# Patient Record
Sex: Female | Born: 1975 | Race: Black or African American | Hispanic: No | Marital: Single | Smoking: Never smoker
Health system: Southern US, Community
[De-identification: ages and names within clinical notes are randomized; demographics above are authoritative.]

## PROBLEM LIST (undated history)

## (undated) HISTORY — PX: BREAST ENHANCEMENT SURGERY: SHX7

---

## 2016-02-18 ENCOUNTER — Emergency Department (HOSPITAL_COMMUNITY)
Admission: EM | Admit: 2016-02-18 | Discharge: 2016-02-18 | Disposition: A | Discharge status: Home / Self Care | Payer: Managed Care, Other (non HMO) | Attending: Emergency Medicine | Admitting: Emergency Medicine

## 2016-02-18 ENCOUNTER — Encounter (HOSPITAL_COMMUNITY): Payer: Self-pay

## 2016-02-18 ENCOUNTER — Emergency Department (HOSPITAL_COMMUNITY): Payer: Managed Care, Other (non HMO)

## 2016-02-18 DIAGNOSIS — M545 Low back pain: Secondary | ICD-10-CM | POA: Insufficient documentation

## 2016-02-18 LAB — POC URINE PREG, ED: Preg Test, Ur: NEGATIVE

## 2016-02-18 MED ORDER — METHOCARBAMOL 500 MG PO TABS: 500.0000 mg | ORAL_TABLET | Freq: Four times a day (QID) | ORAL | 0 refills | Status: DC | PRN

## 2016-02-18 MED ORDER — MELOXICAM 7.5 MG PO TABS: 7.5000 mg | ORAL_TABLET | Freq: Every day | ORAL | 0 refills | Status: DC | PRN

## 2016-02-18 NOTE — ED Notes (Signed)
Declined W/C at D/C and was escorted to lobby by RN. 

## 2016-02-18 NOTE — ED Triage Notes (Signed)
Per Pt, Pt is coming from home with reports of severe lower back pain that started about two weeks ago with no injury noted to be associated. Denies any urinary symptoms with the pain. Reports having some tingling in her legs bilateral, but denies incontinence.

## 2016-02-18 NOTE — ED Notes (Signed)
Pt reports not having a period in August and having a light period for four days this month which is abnormal for the patient. Denies discharge.

## 2016-02-18 NOTE — ED Provider Notes (Signed)
[facility]-EMERGENCY DEPT Provider Note   CSN: [id number] Arrival date & time: 02/18/16  0705     History   Chief Complaint Chief Complaint  Patient presents with  . Back Pain    HPI Kristina Ramsey is a 40 y.o. female.  HPI   Patient presents with right lower back pain x 2 weeks and anterior leg burning sensation x 1.5 weeks.  Pain is constant, severe, unrelieved tylenol and motrin.  Burning of the anterior legs involves her thighs and knees only.  Pt has been treated for a UTI recently, never had symptoms.  Missed her period since July, is having vaginal spotting currently.  Reports abnormal pap smear last year, is supposed to have colposcopy - scheduled for this week.  Reports 10 pound weight loss over 2 weeks. Denies fevers, chills, abdominal pain, loss of control of bowel or bladder, weakness of numbness of the extremities, saddle anesthesia, urinary symptoms, bowel changes.   Denies any injury, change in activity, denies heavy lifting.    History reviewed. No pertinent past medical history.  There are no active problems to display for this patient.   Past Surgical History:  Procedure Laterality Date  . BREAST ENHANCEMENT SURGERY      OB History    No data available       Home Medications    Prior to Admission medications   Medication Sig Start Date End Date Taking? Authorizing Provider  meloxicam (MOBIC) 7.5 MG tablet Take 1 tablet (7.5 mg total) by mouth daily as needed for pain. 02/18/16   Trixie DredgeEmily Joshuwa Vecchio, PA-C  methocarbamol (ROBAXIN) 500 MG tablet Take 1-2 tablets (500-1,000 mg total) by mouth every 6 (six) hours as needed (pain). 02/18/16   Trixie DredgeEmily Brooklin Rieger, PA-C    Family History No family history on file.  Social History Social History  Substance Use Topics  . Smoking status: Never Smoker  . Smokeless tobacco: Never Used  . Alcohol use No     Allergies   Review of patient's allergies indicates no known allergies.   Review of Systems Review of Systems  All  other systems reviewed and are negative.    Physical Exam Updated Vital Signs BP 120/58 (BP Location: Right Arm)   Pulse 80   Temp 98 F (36.7 C) (Oral)   Resp 18   Ht 5\' 3"  (1.6 m)   Wt 82.1 kg   LMP 02/14/2016 Comment: Neg preg test   SpO2 100%   BMI 32.04 kg/m   Physical Exam  Constitutional: She appears well-developed and well-nourished. No distress.  HENT:  Head: Normocephalic and atraumatic.  Neck: Neck supple.  Pulmonary/Chest: Effort normal.  Abdominal: Soft. She exhibits no distension and no mass. There is no tenderness. There is no rebound and no guarding.  Musculoskeletal:       Back:  Spine nontender, no crepitus, or stepoffs. Lower extremities:  Strength 5/5, sensation intact, distal pulses intact.     Neurological: She is alert.  Normal gait   Skin: She is not diaphoretic.  Nursing note and vitals reviewed.    ED Treatments / Results  Labs (all labs ordered are listed, but only abnormal results are displayed) Labs Reviewed  POC URINE PREG, ED    EKG  EKG Interpretation None       Radiology Dg Lumbar Spine Complete  Result Date: 02/18/2016 CLINICAL DATA:  RIGHT low back pain for couple weeks, anterior burning sensation in both legs EXAM: LUMBAR SPINE - COMPLETE 4+ VIEW COMPARISON:  None  FINDINGS: Five non-rib-bearing lumbar vertebra. Osseous mineralization normal. Vertebral body and disc space heights maintained. No acute fracture, subluxation or bone destruction. No spondylolysis. SI joints symmetric. IMPRESSION: No acute abnormalities. Electronically Signed   By: Ulyses Southward M.D.   On: 02/18/2016 10:10    Procedures Procedures (including critical care time)  Medications Ordered in ED Medications - No data to display   Initial Impression / Assessment and Plan / ED Course  I have reviewed the triage vital signs and the nursing notes.  Pertinent labs & imaging results that were available during my care of the patient were reviewed by me  and considered in my medical decision making (see chart for details).  Clinical Course    Afebrile, nontoxic patient with right lower back pain and anterior leg burning.  Xray negative.  Neurovascularly intact on exam.  No red flags by exam.  Doubt acute cord compression, infectious etiology.  D/C home with symptomatic medications, strongly encouraged close PCP follow up.  Discussed result, findings, treatment, and follow up  with patient.  Pt given return precautions.  Pt verbalizes understanding and agrees with plan.       Final Clinical Impressions(s) / ED Diagnoses   Final diagnoses:  Right low back pain, with sciatica presence unspecified    New Prescriptions Discharge Medication List as of 02/18/2016 10:32 AM    START taking these medications   Details  meloxicam (MOBIC) 7.5 MG tablet Take 1 tablet (7.5 mg total) by mouth daily as needed for pain., Starting Sun 02/18/2016, Print    methocarbamol (ROBAXIN) 500 MG tablet Take 1-2 tablets (500-1,000 mg total) by mouth every 6 (six) hours as needed (pain)., Starting Sun 02/18/2016, Print         [place], PA-C 02/18/16 1140    Shaune Pollack, MD 02/20/16 [phone]

## 2016-02-18 NOTE — ED Triage Notes (Signed)
PT reports  She has a UTI and is taking a anti-bx. Pt does not remember the name of anti-bx.

## 2016-02-18 NOTE — Discharge Instructions (Signed)
Read the information below.  Use the prescribed medication as directed.  Please discuss all new medications with your pharmacist.  You may return to the Emergency Department at any time for worsening condition or any new symptoms that concern you.    If you develop fevers, loss of control of bowel or bladder, weakness or numbness in your legs, or are unable to walk, return to the ER for a recheck.  °

## 2016-02-21 ENCOUNTER — Other Ambulatory Visit: Payer: Self-pay | Admitting: Obstetrics & Gynecology

## 2016-02-22 ENCOUNTER — Emergency Department (HOSPITAL_COMMUNITY)
Admission: EM | Admit: 2016-02-22 | Discharge: 2016-02-23 | Disposition: A | Discharge status: Home / Self Care | Payer: Managed Care, Other (non HMO) | Attending: Emergency Medicine | Admitting: Emergency Medicine

## 2016-02-22 ENCOUNTER — Encounter (HOSPITAL_COMMUNITY): Payer: Self-pay | Admitting: *Deleted

## 2016-02-22 DIAGNOSIS — M549 Dorsalgia, unspecified: Secondary | ICD-10-CM | POA: Diagnosis present

## 2016-02-22 DIAGNOSIS — L0591 Pilonidal cyst without abscess: Secondary | ICD-10-CM

## 2016-02-22 LAB — CYTOLOGY - PAP

## 2016-02-22 NOTE — ED Triage Notes (Addendum)
Pt c/o R lower back pain since Monday. Was seen in ED and given prescriptions for mobic and robaxin without any relief. Reports filling mobic, did not fill robaxin. Pt also c/o R sided facial numbness.

## 2016-02-23 MED ORDER — CEPHALEXIN 500 MG PO CAPS: 500.0000 mg | ORAL_CAPSULE | Freq: Two times a day (BID) | ORAL | 0 refills | Status: AC

## 2016-02-23 MED ORDER — METRONIDAZOLE 500 MG PO TABS
500.0000 mg | ORAL_TABLET | Freq: Once | ORAL | Status: AC
  Administered 2016-02-23: 500 mg via ORAL
  Filled 2016-02-23: qty 1

## 2016-02-23 MED ORDER — CEPHALEXIN 250 MG PO CAPS
500.0000 mg | ORAL_CAPSULE | Freq: Once | ORAL | Status: AC
  Administered 2016-02-23: 500 mg via ORAL
  Filled 2016-02-23: qty 2

## 2016-02-23 MED ORDER — METRONIDAZOLE 500 MG PO TABS: 500.0000 mg | ORAL_TABLET | Freq: Two times a day (BID) | ORAL | 0 refills | Status: AC

## 2016-02-23 NOTE — ED Provider Notes (Signed)
[facility]PT Provider Note   CSN: [id number] Arrival date & time: 02/22/16  2328     History   Chief Complaint Chief Complaint  Patient presents with  . Back Pain  . Numbness    HPI Kristina Ramsey is a 40 y.o. female with no sig PMH here with worsening back pain for the past couple of weeks.  She states the pain is more like a pressure at her read end.  This started when she took a long drive (12 hours each way) and has not gone away. It is constant and worse when sitting on her bottom, improved when standing and no pressure is on it.  She denies any redness or drainage.  No history of abscess in the past.  She has taken pain medication prescribe to her earlier in the ED without any relief.  There are no further complaints.  10 Systems reviewed and are negative for acute change except as noted in the HPI.   HPI  History reviewed. No pertinent past medical history.  There are no active problems to display for this patient.   Past Surgical History:  Procedure Laterality Date  . BREAST ENHANCEMENT SURGERY      OB History    No data available       Home Medications    Prior to Admission medications   Medication Sig Start Date End Date Taking? Authorizing Provider  cephALEXin (KEFLEX) 500 MG capsule Take 1 capsule (500 mg total) by mouth 2 (two) times daily. 02/23/16   Tomasita CrumbleAdeleke Philander Ake, MD  metroNIDAZOLE (FLAGYL) 500 MG tablet Take 1 tablet (500 mg total) by mouth 2 (two) times daily. One po bid x 7 days 02/23/16   Tomasita CrumbleAdeleke Deirdra Heumann, MD    Family History No family history on file.  Social History Social History  Substance Use Topics  . Smoking status: Never Smoker  . Smokeless tobacco: Never Used  . Alcohol use No     Allergies   Review of patient's allergies indicates no known allergies.   Review of Systems Review of Systems   Physical Exam Updated Vital Signs BP (!) 111/45 (BP Location: Right Arm)   Pulse 69   Temp 98.3 F (36.8 C) (Oral)   Resp 18    Ht 5\' 3"  (1.6 m)   Wt 180 lb (81.6 kg)   LMP 02/14/2016 Comment: Neg preg test   SpO2 99%   BMI 31.89 kg/m   Physical Exam  Constitutional: She is oriented to person, place, and time. She appears well-developed and well-nourished. No distress.  HENT:  Head: Normocephalic and atraumatic.  Nose: Nose normal.  Mouth/Throat: Oropharynx is clear and moist. No oropharyngeal exudate.  Eyes: Conjunctivae and EOM are normal. Pupils are equal, round, and reactive to light. No scleral icterus.  Neck: Normal range of motion. Neck supple. No JVD present. No tracheal deviation present. No thyromegaly present.  Cardiovascular: Normal rate, regular rhythm and normal heart sounds.  Exam reveals no gallop and no friction rub.   No murmur heard. Pulmonary/Chest: Effort normal and breath sounds normal. No respiratory distress. She has no wheezes. She exhibits no tenderness.  Abdominal: Soft. Bowel sounds are normal. She exhibits no distension and no mass. There is no tenderness. There is no rebound and no guarding.  Musculoskeletal: Normal range of motion. She exhibits no edema or tenderness.  Lymphadenopathy:    She has no cervical adenopathy.  Neurological: She is alert and oriented to person, place, and time. No cranial nerve deficit.  She exhibits normal muscle tone.  Skin: Skin is warm and dry. No rash noted. No erythema. No pallor.  TTP of the gluteal cleft, no redness, swelling, drainage, or abscess formation seen  Nursing note and vitals reviewed.    ED Treatments / Results  Labs (all labs ordered are listed, but only abnormal results are displayed) Labs Reviewed - No data to display  EKG  EKG Interpretation None       Radiology No results found.  Procedures Procedures (including critical care time)  Medications Ordered in ED Medications  cephALEXin (KEFLEX) capsule 500 mg (500 mg Oral Given 02/23/16 0542)  metroNIDAZOLE (FLAGYL) tablet 500 mg (500 mg Oral Given 02/23/16 0542)      Initial Impression / Assessment and Plan / ED Course  I have reviewed the triage vital signs and the nursing notes.  Pertinent labs & imaging results that were available during my care of the patient were reviewed by me and considered in my medical decision making (see chart for details).  Clinical Course    Patient presents to the ED for pain in her bottom. I have high concern for pilonidal cyst, likely inflamed.  I can not tell if there is abscess formation yet so I will DC with abx and general surgery follow up.  There certainly are no signs of abscess to I&D on exam but it could be much deeper than I can palpate.  She demonstrates good understanding of the plan.  She appears well and in NAD. VS remain within her normal limits and she is safe for DC.  Final Clinical Impressions(s) / ED Diagnoses   Final diagnoses:  Pilonidal cyst    New Prescriptions New Prescriptions   CEPHALEXIN (KEFLEX) 500 MG CAPSULE    Take 1 capsule (500 mg total) by mouth 2 (two) times daily.   METRONIDAZOLE (FLAGYL) 500 MG TABLET    Take 1 tablet (500 mg total) by mouth 2 (two) times daily. One po bid x 7 days     Tomasita Crumble, MD 02/23/16 [phone]

## 2016-02-29 ENCOUNTER — Other Ambulatory Visit: Payer: Self-pay | Admitting: Obstetrics & Gynecology

## 2016-03-02 ENCOUNTER — Emergency Department (HOSPITAL_COMMUNITY): Payer: Managed Care, Other (non HMO)

## 2016-03-02 ENCOUNTER — Emergency Department (HOSPITAL_COMMUNITY)
Admission: EM | Admit: 2016-03-02 | Discharge: 2016-03-02 | Disposition: A | Discharge status: Home / Self Care | Payer: Managed Care, Other (non HMO) | Attending: Emergency Medicine | Admitting: Emergency Medicine

## 2016-03-02 ENCOUNTER — Encounter (HOSPITAL_COMMUNITY): Payer: Self-pay | Admitting: *Deleted

## 2016-03-02 DIAGNOSIS — R202 Paresthesia of skin: Secondary | ICD-10-CM | POA: Insufficient documentation

## 2016-03-02 DIAGNOSIS — M5441 Lumbago with sciatica, right side: Secondary | ICD-10-CM | POA: Diagnosis not present

## 2016-03-02 DIAGNOSIS — M545 Low back pain: Secondary | ICD-10-CM | POA: Diagnosis present

## 2016-03-02 LAB — POC URINE PREG, ED: Preg Test, Ur: NEGATIVE

## 2016-03-02 NOTE — ED Provider Notes (Signed)
[facility]-EMERGENCY DEPT Provider Note   CSN: [id number] Arrival date & time: 03/02/16  1408  By signing my name below, I, Emmanuella Mensah, attest that this documentation has been prepared under the direction and in the presence of Johney Perotti, PA-C. Electronically Signed: Angelene GiovanniEmmanuella Mensah, ED Scribe. 03/02/16. 2:59 PM.    History   Chief Complaint Chief Complaint  Patient presents with  . Back Pain   HPI Comments: Kristina Ramsey is a 40 y.o. female who presents to the Emergency Department complaining of gradually worsening moderate lower back pain she describes as pressure onset one month ago. She notes that the pain is worse on palpation. She states that the pain radiated to her tailbone yesterday when her boyfriend applied pressure to the area. She explains that she has been seen by her PCP who initially attributed her symptoms to sciatica but then later diagnosed her with a pilonidal cyst but the pain has not resolved with the antibiotics she was prescribed. No other alleviating factors noted. Pt has not tried any medications PTA. She has NKDA. She denies any recent injuries, falls, trauma, or heavy lifting but reports that she recently drove 12 hours each way for vacation. She denies any fever, chills, numbness, bowel/bladder incontinence, dysuria, hematuria, urinary frequency, nausea, or vomiting.   The history is provided by the patient. No language interpreter was used.    History reviewed. No pertinent past medical history.  There are no active problems to display for this patient.   Past Surgical History:  Procedure Laterality Date  . BREAST ENHANCEMENT SURGERY      OB History    No data available       Home Medications    Prior to Admission medications   Medication Sig Start Date End Date Taking? Authorizing Provider  cephALEXin (KEFLEX) 500 MG capsule Take 1 capsule (500 mg total) by mouth 2 (two) times daily. 02/23/16   Tomasita CrumbleAdeleke Oni, MD  metroNIDAZOLE (FLAGYL) 500  MG tablet Take 1 tablet (500 mg total) by mouth 2 (two) times daily. One po bid x 7 days 02/23/16   Tomasita CrumbleAdeleke Oni, MD    Family History No family history on file.  Social History Social History  Substance Use Topics  . Smoking status: Never Smoker  . Smokeless tobacco: Never Used  . Alcohol use No    Allergies   Review of patient's allergies indicates no known allergies.   Review of Systems Review of Systems  Constitutional: Negative for chills and fever.  Gastrointestinal: Negative for nausea and vomiting.  Genitourinary: Negative for dysuria, frequency and hematuria.  Musculoskeletal: Positive for back pain.  Neurological: Negative for numbness.  All other systems reviewed and are negative.   Physical Exam Updated Vital Signs BP 139/76 (BP Location: Right Arm)   Pulse 113   Temp 98.3 F (36.8 C) (Oral)   Resp 18   LMP 02/14/2016 Comment: Neg preg test   SpO2 98%   Physical Exam  Constitutional: She is oriented to person, place, and time. She appears well-developed and well-nourished. No distress.  HENT:  Head: Normocephalic and atraumatic.  Eyes: Conjunctivae and EOM are normal.  Neck: Neck supple. No tracheal deviation present.  Cardiovascular: Normal rate.   Pulmonary/Chest: Effort normal. No respiratory distress.  Musculoskeletal: Normal range of motion.  No c-spine or t-spine tenderness. Some lower l-spine and sacral tenderness. Right lumbar paraspinal tenderness and spasm.   Neurological: She is alert and oriented to person, place, and time.  Reflex Scores:  Patellar reflexes are 2+ on the right side and 2+ on the left side.      Achilles reflexes are 2+ on the right side and 2+ on the left side. Moves all extremities freely 5/5 strength throughout Steady gait  Skin: Skin is warm and dry.  Psychiatric: Her behavior is normal. Her mood appears anxious.  Nursing note and vitals reviewed.   ED Treatments / Results  DIAGNOSTIC STUDIES: Oxygen  Saturation is 98% on RA, normal by my interpretation.    COORDINATION OF CARE: 2:55 PM- Pt advised of plan for treatment and pt agrees. Pt will receive back x-ray for further evaluation.    Labs (all labs ordered are listed, but only abnormal results are displayed) Labs Reviewed - No data to display  EKG  EKG Interpretation None       Radiology Dg Lumbar Spine Complete  Result Date: 03/02/2016 CLINICAL DATA:  Low back pain and pressure for 1 month. No known injury. EXAM: LUMBAR SPINE - COMPLETE 4+ VIEW COMPARISON:  None. FINDINGS: There is no evidence of lumbar spine fracture. No evidence of pars interarticularis defect. Alignment is normal. Intervertebral disc spaces are maintained. Upper sacrum appears intact and normal in mineralization. Visualized paravertebral soft tissues are unremarkable. IMPRESSION: Negative. Electronically Signed   By: Bary Richard M.D.   On: 03/02/2016 17:01    Procedures Procedures (including critical care time)  Medications Ordered in ED Medications - No data to display   Initial Impression / Assessment and Plan / ED Course  Noelle Penner, PA-C has reviewed the triage vital signs and the nursing notes.  Pertinent labs & imaging results that were available during my care of the patient were reviewed by me and considered in my medical decision making (see chart for details).  Clinical Course   Declining all pain meds. Mildly tachycardic (113 in triage, down to 100 on my exam) but pt is anxious. She has no red flags for cauda equina or other emergent spinal pathology. Neuro exam is intact. Given mild tenderness x-ray was obtained and is negative. She continues to be anxious. However, with her very reassuring exam and no red flag signs/symptoms, no indication for emergent MRI tonight. Instructed to f/u with ortho/spine specialist. Pt declining all meds. ER return precautions given.  Final Clinical Impressions(s) / ED Diagnoses   Final diagnoses:    Paresthesias  Right-sided low back pain with right-sided sciatica    New Prescriptions Discharge Medication List as of 03/02/2016  5:32 PM     I personally performed the services described in this documentation, which was scribed in my presence. The recorded information has been reviewed and is accurate.    Carlene Coria, PA-C 03/02/16 1911    Azalia Bilis, MD 03/03/16 [phone]

## 2016-03-02 NOTE — ED Notes (Signed)
Bed: WTR8 Expected date:  Expected time:  Means of arrival:  Comments: 

## 2016-03-02 NOTE — Discharge Instructions (Signed)
Your x-ray was normal. You declined prescriptions for pain medicine or muscle relaxers. Please follow up with your primary care provider as well as Dr. Yevette Edwardsumonski, a spine specialist. Return to the ER for new or worsening symptoms.

## 2016-03-02 NOTE — ED Triage Notes (Signed)
Pt complains of mid lower back pain for the past month. Pt states the pain is worse with palpation. Pt states pain radiates to tailbone.

## 2016-04-02 DIAGNOSIS — G8929 Other chronic pain: Secondary | ICD-10-CM | POA: Insufficient documentation

## 2016-04-02 DIAGNOSIS — M545 Low back pain: Secondary | ICD-10-CM

## 2016-04-02 DIAGNOSIS — F32A Depression, unspecified: Secondary | ICD-10-CM | POA: Insufficient documentation

## 2016-04-02 DIAGNOSIS — F329 Major depressive disorder, single episode, unspecified: Secondary | ICD-10-CM | POA: Insufficient documentation

## 2016-04-02 NOTE — Progress Notes (Deleted)
*  IMAGE* Office Visit Note  Patient: Kristina Ramsey             Date of Birth: [birth year]           MRN: [id number]             PCP: Rhetta MuraAlthisar, Henry PA Referring[facility]Althisar, Henry [facility] Visit Date: 04/03/2016 Occupation:@GUAROCC @    Subjective: Multiple arthralgias   History of Present Illness: Kristina HarmanLakeisha Emmick is a 40 y.o. female referred for evaluation of multiple arthralgias and elevated sedimentation rate.   Activities of Daily Living:  Patient reports morning stiffness for *** {minute/hour:19697}.   Patient {ACTIONS;DENIES/REPORTS:21021675::"Denies"} nocturnal pain.  Difficulty dressing/grooming: {ACTIONS;DENIES/REPORTS:21021675::"Denies"} Difficulty climbing stairs: {ACTIONS;DENIES/REPORTS:21021675::"Denies"} Difficulty getting out of chair: {ACTIONS;DENIES/REPORTS:21021675::"Denies"} Difficulty using hands for taps, buttons, cutlery, and/or writing: {ACTIONS;DENIES/REPORTS:21021675::"Denies"}   No Rheumatology ROS completed.   PMFS History:  Patient Active Problem List   Diagnosis Date Noted  . Chronic low back pain 04/02/2016  . Depression 04/02/2016    No past medical history on file.  No family history on file. Past Surgical History:  Procedure Laterality Date  . BREAST ENHANCEMENT SURGERY     Social History   Social History Narrative  . No narrative on file     Objective: Vital Signs: There were no vitals taken for this visit.   Physical Exam   Musculoskeletal Exam: ***  CDAI Exam: No CDAI exam completed.    Investigation: Findings:  02/13/2016 rheumatid factor less than 7    Imaging: No results found.  Speciality Comments: No specialty comments available.    Procedures:  No procedures performed Allergies: Review of patient's allergies indicates no known allergies.   Assessment / Plan: Visit Diagnoses: Chronic low back pain, unspecified back pain laterality, with sciatica presence  unspecified  Depression, unspecified depression type    Orders: No orders of the defined types were placed in this encounter.  No orders of the defined types were placed in this encounter.   Face-to-face time spent with patient was *** minutes. 50% of time was spent in counseling and coordination of care.  Follow-Up Instructions: No Follow-up on file.

## 2016-04-03 ENCOUNTER — Ambulatory Visit: Payer: Self-pay | Admitting: Rheumatology

## 2016-05-07 ENCOUNTER — Ambulatory Visit: Payer: Self-pay | Admitting: Rheumatology

## 2018-07-13 IMAGING — CR DG LUMBAR SPINE COMPLETE 4+V
5 series · 5 of 5 positions shown · non-contrast
Comparison: None.

CLINICAL DATA: Low back pain and pressure for 1 month. No known
injury.

EXAM:
LUMBAR SPINE - COMPLETE 4+ VIEW

[t lumbar spine ap]
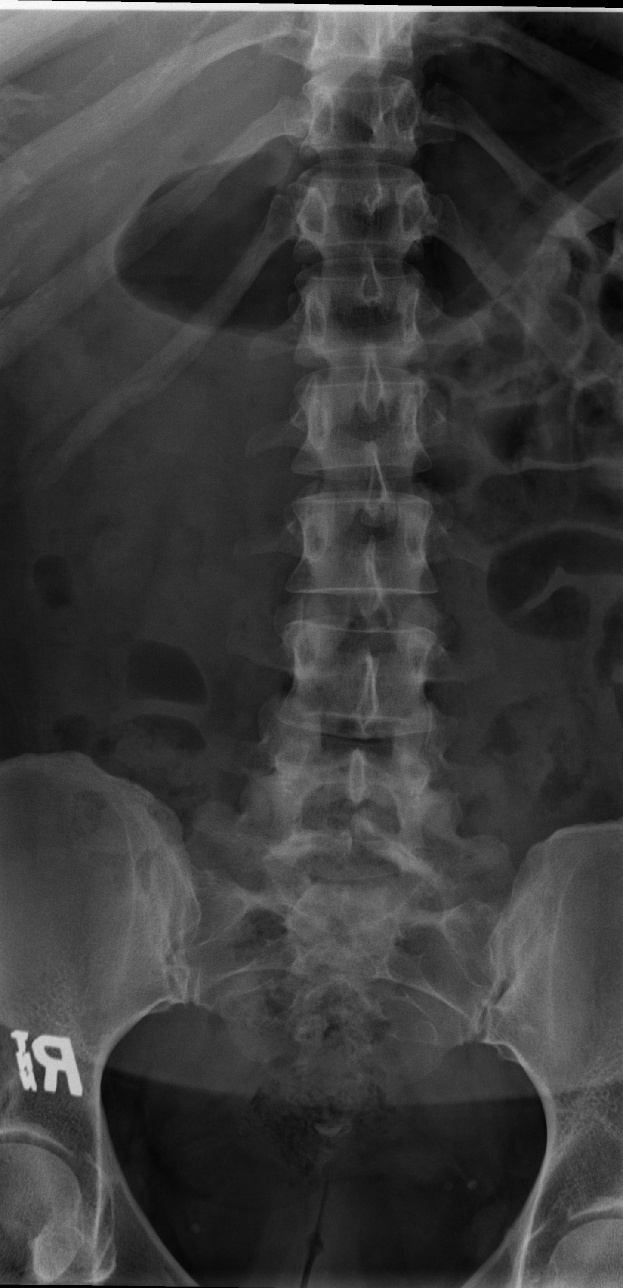

[t lumbar spine obl (1 of 2)]
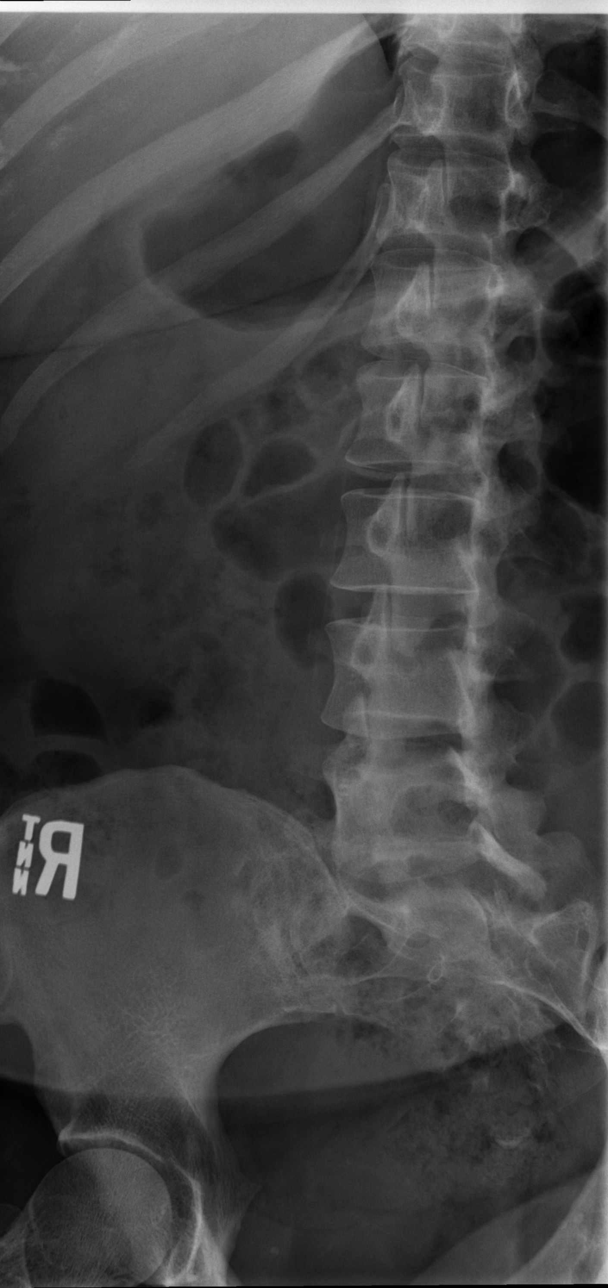

[t lumbar spine obl (2 of 2)]
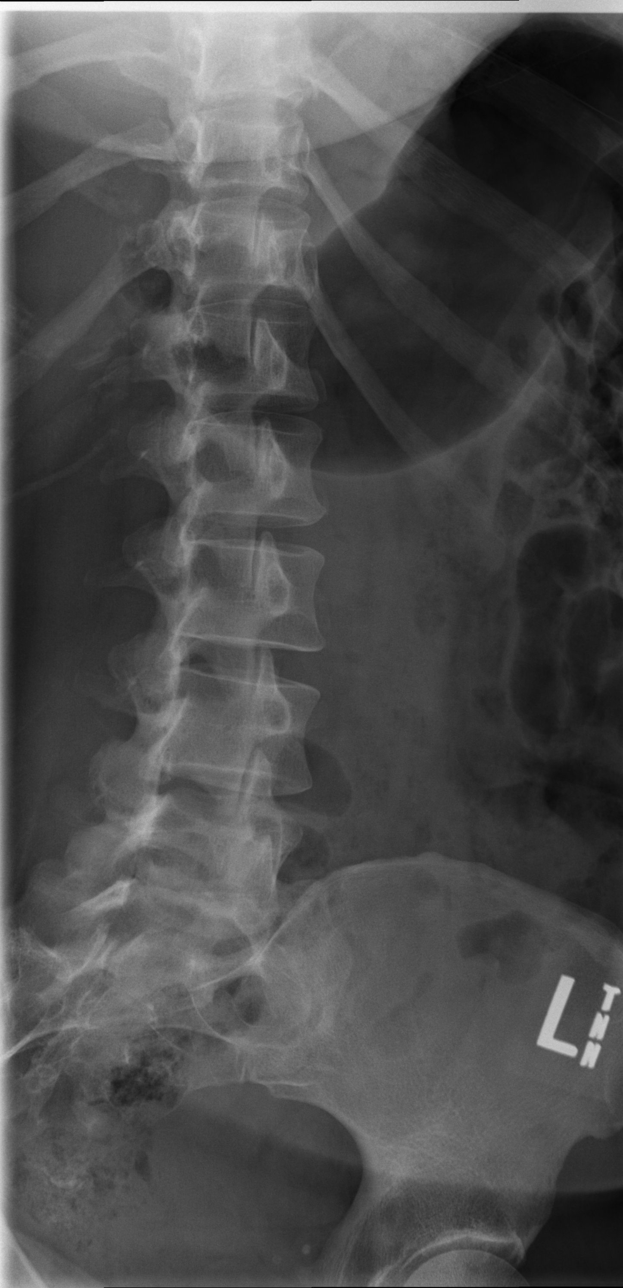

[t lumbar spine lat]
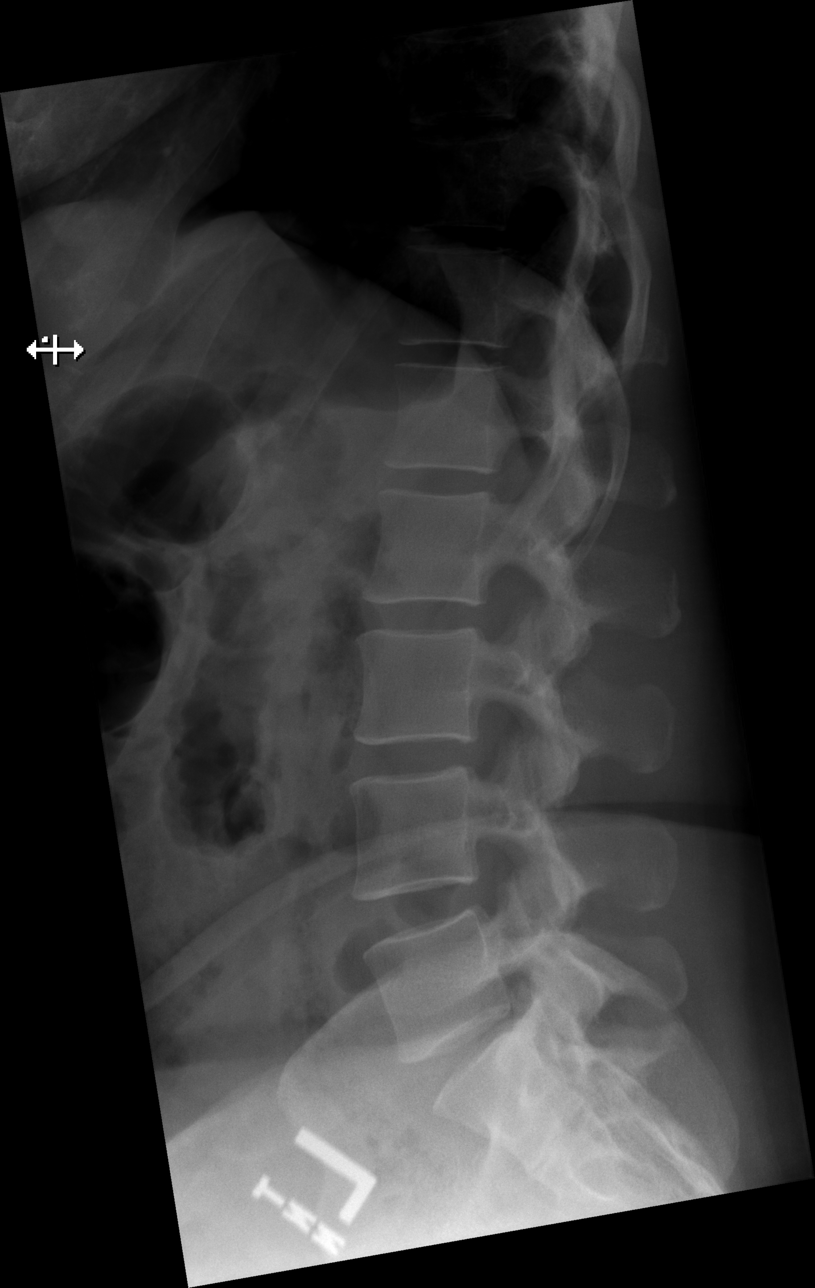

[t lumbar l-5 s-1 spot]
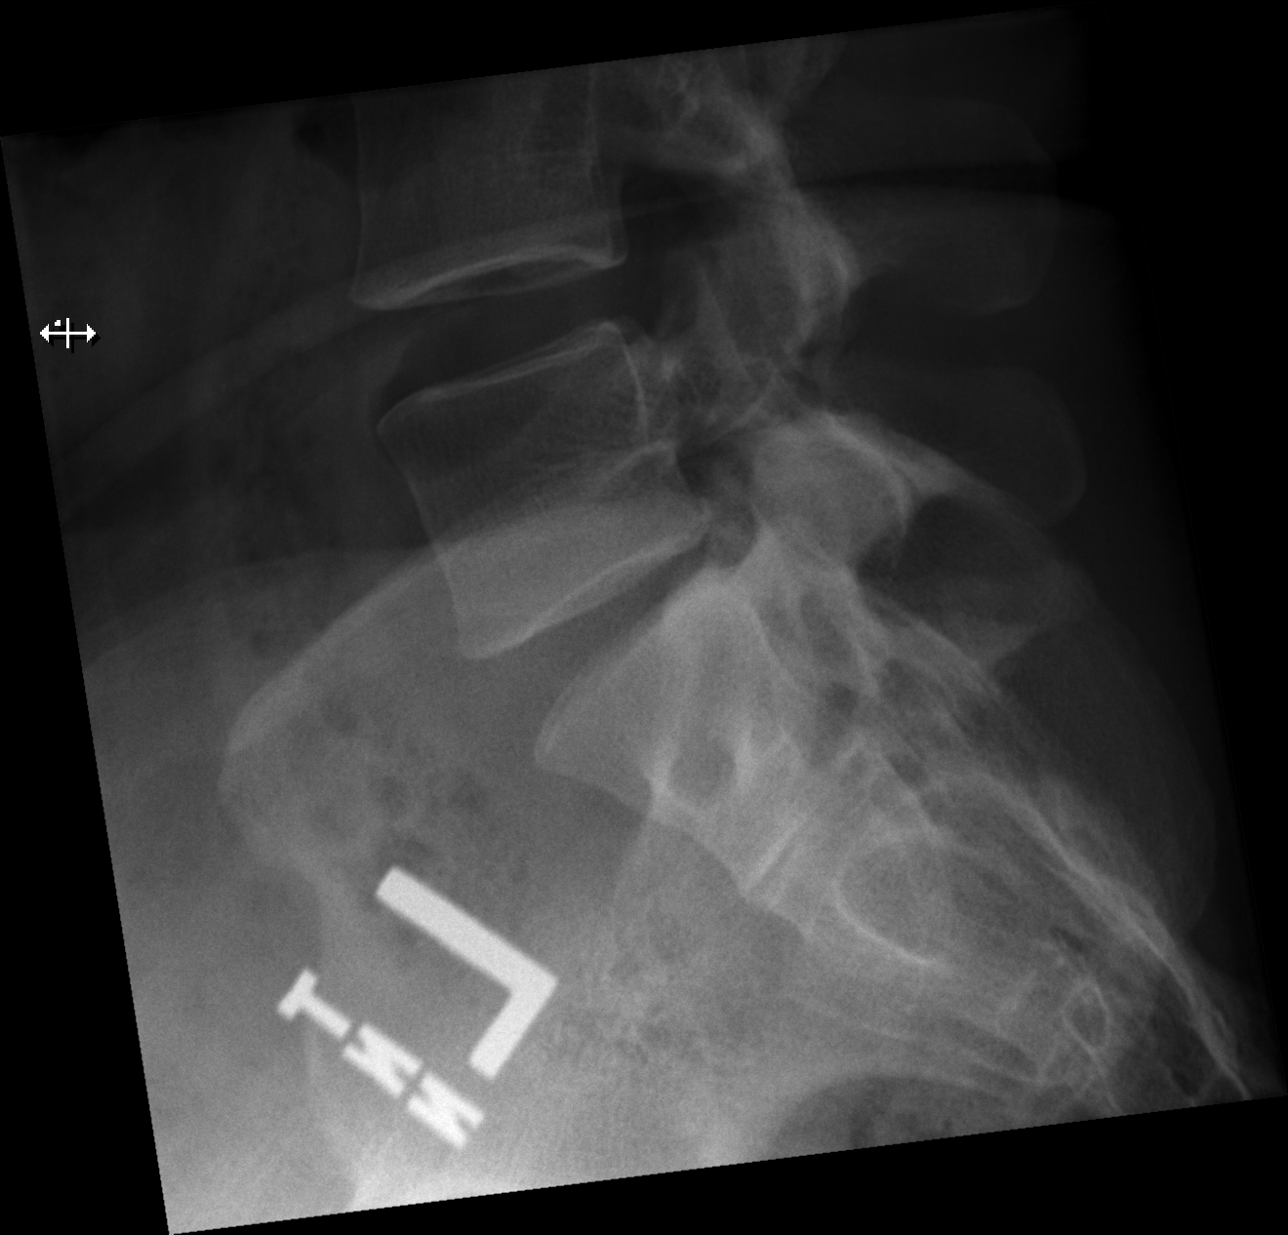

[5 of 5 positions shown; findings below may reference images not displayed]

FINDINGS: There is no evidence of lumbar spine fracture. No evidence of pars
interarticularis defect. Alignment is normal. Intervertebral disc
spaces are maintained. Upper sacrum appears intact and normal in
mineralization. Visualized paravertebral soft tissues are
unremarkable.
IMPRESSION: Negative.
